# Patient Record
Sex: Male | Born: 2000 | Race: Black or African American | Hispanic: No | Marital: Single | Smoking: Former smoker
Health system: Southern US, Community
[De-identification: ages and names within clinical notes are randomized; demographics above are authoritative.]

## PROBLEM LIST (undated history)

## (undated) DIAGNOSIS — R278 Other lack of coordination: Secondary | ICD-10-CM

## (undated) DIAGNOSIS — F902 Attention-deficit hyperactivity disorder, combined type: Secondary | ICD-10-CM

## (undated) DIAGNOSIS — F951 Chronic motor or vocal tic disorder: Secondary | ICD-10-CM

## (undated) HISTORY — DX: Attention-deficit hyperactivity disorder, combined type: F90.2

## (undated) HISTORY — DX: Other lack of coordination: R27.8

## (undated) HISTORY — DX: Chronic motor or vocal tic disorder: F95.1

---
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
## 2020-03-11 ENCOUNTER — Other Ambulatory Visit: Payer: Self-pay

## 2020-03-11 NOTE — Telephone Encounter (Signed)
Mom called in for refill for Evekeo and Buspar. Last visit 12/19/2019 next visit 05/05/2020. Please escribe to [facility]

## 2020-03-12 MED ORDER — BUSPIRONE HCL 15 MG PO TABS: 15.0000 mg | ORAL_TABLET | Freq: Every day | ORAL | 2 refills | Status: DC

## 2020-03-12 MED ORDER — AMPHETAMINE SULFATE 10 MG PO TABS: 10.0000 mg | ORAL_TABLET | Freq: Two times a day (BID) | ORAL | 0 refills | Status: DC

## 2020-03-12 NOTE — Telephone Encounter (Signed)
RX for above e-scribed and sent to pharmacy on record  [facility] - [place], [place] - [address] [place] [address] Phone: [phone] Fax: [phone]    

## 2020-05-05 ENCOUNTER — Encounter: Payer: Medicaid Other | Admitting: Pediatrics

## 2020-05-21 ENCOUNTER — Other Ambulatory Visit: Payer: Self-pay

## 2020-05-21 ENCOUNTER — Telehealth: Payer: Medicaid Other | Admitting: Pediatrics

## 2020-06-01 ENCOUNTER — Encounter: Payer: Self-pay | Admitting: Pediatrics

## 2020-06-01 ENCOUNTER — Telehealth (INDEPENDENT_AMBULATORY_CARE_PROVIDER_SITE_OTHER): Payer: Medicaid Other | Admitting: Pediatrics

## 2020-06-01 ENCOUNTER — Other Ambulatory Visit: Payer: Self-pay

## 2020-06-01 DIAGNOSIS — Z79899 Other long term (current) drug therapy: Secondary | ICD-10-CM

## 2020-06-01 DIAGNOSIS — Z719 Counseling, unspecified: Secondary | ICD-10-CM

## 2020-06-01 DIAGNOSIS — F902 Attention-deficit hyperactivity disorder, combined type: Secondary | ICD-10-CM | POA: Diagnosis not present

## 2020-06-01 DIAGNOSIS — Z7189 Other specified counseling: Secondary | ICD-10-CM

## 2020-06-01 DIAGNOSIS — R278 Other lack of coordination: Secondary | ICD-10-CM | POA: Diagnosis not present

## 2020-06-01 MED ORDER — BUSPIRONE HCL 15 MG PO TABS: 15.0000 mg | ORAL_TABLET | Freq: Every day | ORAL | 2 refills | Status: DC

## 2020-06-01 MED ORDER — AMPHETAMINE SULFATE 10 MG PO TABS: 10.0000 mg | ORAL_TABLET | Freq: Two times a day (BID) | ORAL | 0 refills | Status: DC

## 2020-06-01 NOTE — Patient Instructions (Signed)
DISCUSSION: Counseled regarding the following coordination of care items:  Continue medication as directed Evekeo 10 mg twice daily Buspar 15 mg daily RX for above e-scribed and sent to pharmacy on record  [facility] [place], [place] - [address] [place] [address] Phone: [phone] Fax: [phone]   Counseled regarding obtaining refills by calling pharmacy first to use automated refill request then if needed, call our office leaving a detailed message on the refill line.  Counseled medication administration, effects, and possible side effects.  ADHD medications discussed to include different medications and pharmacologic properties of each. Recommendation for specific medication to include dose, administration, expected effects, possible side effects and the risk to benefit ratio of medication management.  Advised importance of:  Good sleep hygiene (8- 10 hours per night) Continue good sleep behaviors Limited screen time (none on school nights, no more than 2 hours on weekends) Encouraged drawing on paper not on our skin Regular exercise(outside and active play) Continue with soccer and as many in person social activities as is safe to attend Healthy eating (drink water, no sodas/sweet tea) Excellent and continue good habits.  Counseling at this visit included the review of old records and/or current chart.   Counseling included the following discussion points presented at every visit to improve understanding and treatment compliance.  School progress and continued advocay for appropriate accommodations to include maintain Structure, routine, organization, reward, motivation and consequences.  Additionally the patient was counseled to take medication while driving.

## 2020-06-01 NOTE — Progress Notes (Signed)
[facility] [address], [place] [place] [address] Dept: [phone] Dept Fax: [phone]  Medication Check by Caregility due to COVID-19  Patient ID:  Benjamin Frank  male DOB: 2000-10-14   20 y.o.   MRN: [id number]   DATE:06/01/20  PCP: Loyola Mast, MD  Interviewed: Dione Housekeeper and Mother: Natale Milch Location: Parents home Provider location: [facility] office  Virtual Visit via Video Note Connected with Benjamin Frank on 06/01/20 at  9:30 AM EST by video enabled telemedicine application and verified that I am speaking with the correct person using two identifiers.     I discussed the limitations, risks, security and privacy concerns of performing an evaluation and management service by telephone and the availability of in person appointments. I also discussed with the parent/patient that there may be a patient responsible charge related to this service. The parent/patient expressed understanding and agreed to proceed.  HISTORY OF PRESENT ILLNESS/CURRENT STATUS: Benjamin Frank is being followed for medication management for ADHD, dysgraphia and learning differences.   Last visit on 12/19/19  Benjamin Frank currently prescribed Evekeo 10 mg twice daily, Buspar 15 mg    Behaviors: doing well, personable and maturing  Eating well (eating breakfast, lunch and dinner).   Elimination: no concerns  Sleeping:  Sleeping through the night.   EDUCATION: School: [employer]  Five classes Stats, astronomy, Forensic scientist, lab, Genetics Doing well with some on line but mostly in person   Has zoom links for every class except lab - has to be in person  Working - [employer], now shift Sport and exercise psychologist Exercise: daily  Soccer - avid player  Screen time: (phone, tablet, TV, computer): non-essential, not excessive  MEDICAL HISTORY: Individual Medical History/ Review of Systems: Changes? : New tat on left  forearm  Family Medical/ Social History: Changes? No   Patient Lives with: mother and father  Current Medications:  Evekeo 10 mg twice daily Buspar 15 mg every day  Medication Side Effects: None  MENTAL HEALTH: No current issues with  sadness, loneliness or depression.  No self harm or thoughts of self harm or injury. - new tat Denies fears, worries and anxieties. Has good peer relations and is not a bully nor is victimized.  ASSESSMENT: Doing well, compliant with medication verbally.  Last refill per PDMP aware 10/14/2019.  Non compliant.  Counseled daily medication.  Counseled regarding continued vape cessation and support for alternative behaviors/activities. Discussed strategies for work production at [employer] and college success (dysgraphia coping).  DIAGNOSES:    ICD-10-CM   1. ADHD (attention deficit hyperactivity disorder), combined type  F90.2   2. Dysgraphia  R27.8   3. Medication management  Z79.899   4. Patient counseled  Z71.9   5. Parenting dynamics counseling  Z71.89   6. Counseling and coordination of care  Z71.89      RECOMMENDATIONS:  Patient Instructions  DISCUSSION: Counseled regarding the following coordination of care items:  Continue medication as directed Evekeo 10 mg twice daily Buspar 15 mg daily RX for above e-scribed and sent to pharmacy on record  [facility] - [place], [place] - [address] [place] [address] Phone: [phone] Fax: [phone]   Counseled regarding obtaining refills by calling pharmacy first to use automated refill request then if needed, call our office leaving a detailed message on the refill line.  Counseled medication administration, effects, and possible  side effects.  ADHD medications discussed to include different medications and pharmacologic properties of each. Recommendation for specific medication to include dose, administration, expected effects, possible side  effects and the risk to benefit ratio of medication management.  Advised importance of:  Good sleep hygiene (8- 10 hours per night) Continue good sleep behaviors Limited screen time (none on school nights, no more than 2 hours on weekends) Encouraged drawing on paper not on our skin Regular exercise(outside and active play) Continue with soccer and as many in person social activities as is safe to attend Healthy eating (drink water, no sodas/sweet tea) Excellent and continue good habits.  Counseling at this visit included the review of old records and/or current chart.   Counseling included the following discussion points presented at every visit to improve understanding and treatment compliance.  School progress and continued advocay for appropriate accommodations to include maintain Structure, routine, organization, reward, motivation and consequences.  Additionally the patient was counseled to take medication while driving.           NEXT APPOINTMENT:  Return in about 6 months (around 11/29/2020) for Medication Check. Please call the office for a sooner appointment if problems arise.  Medical Decision-making:  I spent 25 minutes dedicated to the care of this patient on the date of this encounter to include face to face time with the patient and/or parent reviewing medical records and documentation by teachers, performing and discussing the assessment and treatment plan, reviewing and explaining completed speciality labs and obtaining specialty lab samples.  The patient and/or parent was provided an opportunity to ask questions and all were answered. The patient and/or parent agreed with the plan and demonstrated an understanding of the instructions.   The patient and/or parent was advised to call back or seek an in-person evaluation if the symptoms worsen or if the condition fails to improve as anticipated.  I provided 25 minutes of non-face-to-face time during this  encounter.   Completed record review for 5 minutes prior to and after the virtual video visit.   Counseling Time: 25 minutes   Total Contact Time: 30 minutes

## 2020-09-22 ENCOUNTER — Encounter: Payer: Medicaid Other | Admitting: Pediatrics

## 2020-09-27 ENCOUNTER — Encounter: Payer: Medicaid Other | Admitting: Pediatrics

## 2020-12-13 ENCOUNTER — Encounter: Payer: Self-pay | Admitting: Pediatrics

## 2020-12-13 ENCOUNTER — Ambulatory Visit (INDEPENDENT_AMBULATORY_CARE_PROVIDER_SITE_OTHER): Payer: Medicaid Other | Admitting: Pediatrics

## 2020-12-13 ENCOUNTER — Other Ambulatory Visit: Payer: Self-pay

## 2020-12-13 VITALS — Ht 68.5 in | Wt 164.0 lb

## 2020-12-13 DIAGNOSIS — Z719 Counseling, unspecified: Secondary | ICD-10-CM

## 2020-12-13 DIAGNOSIS — F902 Attention-deficit hyperactivity disorder, combined type: Secondary | ICD-10-CM | POA: Diagnosis not present

## 2020-12-13 DIAGNOSIS — R278 Other lack of coordination: Secondary | ICD-10-CM

## 2020-12-13 DIAGNOSIS — Z7189 Other specified counseling: Secondary | ICD-10-CM | POA: Diagnosis not present

## 2020-12-13 DIAGNOSIS — Z79899 Other long term (current) drug therapy: Secondary | ICD-10-CM

## 2020-12-13 MED ORDER — BUSPIRONE HCL 15 MG PO TABS: 15.0000 mg | ORAL_TABLET | Freq: Every day | ORAL | 2 refills | Status: DC

## 2020-12-13 MED ORDER — AMPHETAMINE SULFATE 10 MG PO TABS: 10.0000 mg | ORAL_TABLET | Freq: Two times a day (BID) | ORAL | 0 refills | Status: DC

## 2020-12-13 NOTE — Patient Instructions (Addendum)
DISCUSSION: Counseled regarding the following coordination of care items:  Continue medication as directed Evekeo 10 mg 1 or 2 daily BuSpar 15 mg daily RX for above e-scribed and sent to pharmacy on record  [facility] [place], [place] - [address] [place] [address] Phone: [phone] Fax: [phone]   Advised importance of:  Sleep Maintain good sleep hygiene with bedtime no later than 2400 Limited screen time (none on school nights, no more than 2 hours on weekends) Always avoid nonsense social media and decrease screen time overall Regular exercise(outside and active play) Continue with good physical activities and sports participation Healthy eating (drink water, no sodas/sweet tea) Protein rich diet avoiding junk food and empty calories

## 2020-12-13 NOTE — Progress Notes (Signed)
Medication Check  Patient ID: Benjamin Frank  DOB: [id number]  MRN: [id number]  DATE:12/13/20 Loyola Mast, MD  Accompanied by: Mother Patient Lives with: mother and father [name] 23 and [name]  HISTORY/CURRENT STATUS: Chief Complaint - Polite and cooperative and present for medical follow up for medication management of ADHD, dysgraphia and  learning differences. Last follow up in person 12/19/19 and 06/01/20 by video. Evekeo 10 mg, using 20 mg every morning per patient's however last refill filled on 06/01/2020 for a 60 capsule dispensed.  [name] has largely been unmedicated.  BuSpar 15 mg taking 1 daily last written on 06/01/2020 for a 30-day supply with 2 refills.  He states he is also taking this medication.    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  No Sleep concerns.  Elimination: no concerns  Individual Medical History/ Review of Systems: Changes? :No  Family Medical/ Social History: Changes? No  MENTAL HEALTH: Denies sadness, loneliness or depression.  Denies self harm or thoughts of self harm or injury. Denies fears, worries and anxieties. has good peer relations and is not a bully nor is victimized.  Work is a stress   PHYSICAL EXAM; Vitals:   12/13/20 1442  Weight: 164 lb (74.4 kg)  Height: 5' 8.5" (1.74 m)   Body mass index is 24.57 kg/m.  General Physical Exam: Unchanged from previous exam, date:12/18/20    ASSESSMENT:  [name] is a 20 year old with a  diagnosis of ADHD/dysgraphia that is largely unmedicated.  His last refills were written and feels around 06/01/2020 for quantities that should have run out after at most 2 months.  [name] is largely unmedicated but remain successful.  There are no behavioral concerns at home and he is successful academically as well as during his work with consideration of promotion. We discussed the continued realization of when he is off medication versus on medication and differences in his performance and executive function.  He is certainly old enough to self determine if medication continues to be beneficial.  I do recommend daily medication especially for academic success. Continue good physical active play and involvement in sports as well as maintaining good sleep hygiene.  Reducing screen time and getting adequate calories that are protein rich avoiding junk food and empty calories. We will meet again in 6 months to discuss progress and address medication needs at that time.  DIAGNOSES:    ICD-10-CM   1. ADHD (attention deficit hyperactivity disorder), combined type  F90.2     2. Dysgraphia  R27.8     3. Medication management  Z79.899     4. Patient counseled  Z71.9     5. Parenting dynamics counseling  Z71.89       RECOMMENDATIONS:  Patient Instructions  DISCUSSION: Counseled regarding the following coordination of care items:  Continue medication as directed Evekeo 10 mg 1 or 2 daily BuSpar 15 mg daily RX for above e-scribed and sent to pharmacy on record  [facility] [place], [place] [address] [place] [address] Phone: [phone] Fax: [phone]   Advised importance of:  Sleep Maintain good sleep hygiene with bedtime no later than 2400 Limited screen time (none on school nights, no more than 2 hours on weekends) Always avoid nonsense social media and decrease screen time overall Regular exercise(outside and active  play) Continue with good physical activities and sports participation Healthy eating (drink water, no sodas/sweet tea) Protein rich diet avoiding junk food and empty calories    Mother and patient verbalized understanding of all topics discussed.  NEXT APPOINTMENT:  Return in about 6 months (around 06/15/2021) for Medication Check.  Disclaimer: This documentation was generated through the use of dictation and/or voice recognition software, and as such, may contain spelling or other transcription errors. Please disregard any inconsequential errors.  Any questions regarding the content of this documentation should be directed to the individual who electronically signed.

## 2021-01-29 DIAGNOSIS — H5213 Myopia, bilateral: Secondary | ICD-10-CM | POA: Diagnosis not present

## 2021-05-05 DIAGNOSIS — Z6824 Body mass index (BMI) 24.0-24.9, adult: Secondary | ICD-10-CM | POA: Diagnosis not present

## 2021-05-05 DIAGNOSIS — L814 Other melanin hyperpigmentation: Secondary | ICD-10-CM | POA: Diagnosis not present

## 2021-05-05 DIAGNOSIS — F419 Anxiety disorder, unspecified: Secondary | ICD-10-CM | POA: Diagnosis not present

## 2021-05-05 DIAGNOSIS — L819 Disorder of pigmentation, unspecified: Secondary | ICD-10-CM | POA: Diagnosis not present

## 2021-05-05 DIAGNOSIS — F901 Attention-deficit hyperactivity disorder, predominantly hyperactive type: Secondary | ICD-10-CM | POA: Diagnosis not present

## 2021-05-17 ENCOUNTER — Ambulatory Visit (INDEPENDENT_AMBULATORY_CARE_PROVIDER_SITE_OTHER): Payer: Medicaid Other | Admitting: Pediatrics

## 2021-05-17 ENCOUNTER — Encounter: Payer: Self-pay | Admitting: Pediatrics

## 2021-05-17 ENCOUNTER — Other Ambulatory Visit: Payer: Self-pay

## 2021-05-17 VITALS — Ht 69.0 in | Wt 160.0 lb

## 2021-05-17 DIAGNOSIS — Z79899 Other long term (current) drug therapy: Secondary | ICD-10-CM | POA: Diagnosis not present

## 2021-05-17 DIAGNOSIS — Z7189 Other specified counseling: Secondary | ICD-10-CM

## 2021-05-17 DIAGNOSIS — R278 Other lack of coordination: Secondary | ICD-10-CM | POA: Diagnosis not present

## 2021-05-17 DIAGNOSIS — F902 Attention-deficit hyperactivity disorder, combined type: Secondary | ICD-10-CM

## 2021-05-17 DIAGNOSIS — Z719 Counseling, unspecified: Secondary | ICD-10-CM

## 2021-05-17 MED ORDER — AMPHETAMINE SULFATE 10 MG PO TABS: 10.0000 mg | ORAL_TABLET | Freq: Two times a day (BID) | ORAL | 0 refills | Status: DC

## 2021-05-17 MED ORDER — BUSPIRONE HCL 15 MG PO TABS: 15.0000 mg | ORAL_TABLET | Freq: Every day | ORAL | 2 refills | Status: DC

## 2021-05-17 NOTE — Patient Instructions (Signed)
DISCUSSION: Counseled regarding the following coordination of care items:  Continue medication as directed Evekeo 10 mg every morning Buspar 15 mg every morning RX for above e-scribed and sent to pharmacy on record  [facility] #[id number] [place], [place] - [address] AT [facility] & [address] [place] [address] Phone: [phone] Fax: [phone]  Advised importance of:  Sleep Maintain good routines, avoid late nights Limited screen time (none on school nights, no more than 2 hours on weekends) Always reduce screen time Regular exercise(outside and active play) Daily physical activities Healthy eating (drink water, no sodas/sweet tea) Protein rich and avoid junk  Additional resources for parents:  [facility] - https://childmind.org/ [employer]ca

## 2021-05-17 NOTE — Progress Notes (Signed)
Medication Check  Patient ID: Benjamin Frank  DOB: [birth year]  MRN: [id number]  DATE:05/17/21 Benjamin Mast, MD  Accompanied by: Self Patient Lives with: mother and father Brothers - Richard 24 and [name]  HISTORY/CURRENT STATUS: Chief Complaint - Polite and cooperative and present for medical follow up for medication management of ADHD, dysgraphia and learning differences. Last follow up 12/13/20 and currently prescribed Evekeo 10 mg taking one for school and usually two for testing days.  Bupsar 15 mg every morning.   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  MEDICAL HISTORY: Appetite: WNL   Sleep: Bedtime: 2 am on break - earlier on school days   Concerns: Initiation/Maintenance/Other: Asleep easily, sleeps through the night, feels well-rested.  No Sleep concerns.  Elimination: no concerns  Individual Medical History/ Review of Systems: Changes? :Yes will have cyst off ear - otoplasty pending  Family Medical/ Social History: Changes? No  MENTAL HEALTH: Denies sadness, loneliness or depression.  Denies self harm or thoughts of self harm or injury. Denies fears, worries and anxieties. Has good peer relations and is not a bully nor is victimized.  Depression screen [facility] 2/9 05/17/2021  Decreased Interest 0  Down, Depressed, Hopeless 0  PHQ - 2 Score 0   GAD 7 : Generalized Anxiety Score 05/17/2021  Nervous, Anxious, on Edge 0  Control/stop worrying 0  Worry too much - different things 0  Trouble relaxing  0  Restless 0  Easily annoyed or irritable 0  Afraid - awful might happen 0  Total GAD 7 Score 0  Anxiety Difficulty Not difficult at all     Adult ADHD Self Report Scale (most recent)     Adult ADHD Self-Report Scale (ASRS-v1.1) Symptom Checklist - 05/17/21 1410       Part A   1. How often do you have trouble wrapping up the final details of a project, once the challenging parts have been done? Sometimes  2. How often do you have difficulty getting things done in order when you have to do a task that requires organization? Sometimes    3. How often do you have problems remembering appointments or obligations? Sometimes  4. When you have a task that requires a lot of thought, how often do you avoid or delay getting started? Rarely    5. How often do you fidget or squirm with your hands or feet when you have to sit down for a long time? Very Often  6. How often do you feel overly active and compelled to do things, like you were driven by a motor? Often      Part B   7. How often do you make careless mistakes when you have to work on a boring or difficult project? Rarely  8. How often do you have difficulty keeping your attention when you are doing boring or repetitive work? Never    9. How often do you have difficulty concentrating on what people say to you, even when they are speaking  to you directly? Rarely  10. How often do you misplace or have difficulty finding things at home or at work? Rarely    11. How often are you distracted by activity or noise around you? Sometimes  12. How often do you leave your seat in meetings or other situations in which you are expected to remain seated? Never    13. How often do you feel restless or fidgety? Rarely  14. How often do you have difficulty unwinding and relaxing when you have time to yourself? Never    15. How often do you find yourself talking too much when you are in social situations? Rarely  16. When you are in a conversation, how often do  you find yourself finishing the sentences of the people you are talking to, before they can finish them themselves? Never    17. How often do you have difficulty waiting your turn in situations when turn taking is required? Rarely  18. How often do you interrupt others when they are busy? Never              PHYSICAL EXAM; Vitals:   05/17/21 1354  Weight: 160 lb (72.6 kg)  Height: 5\' 9"  (1.753 m)   Body mass index is 23.63 kg/m.  General Physical Exam: Unchanged from previous exam, date:12/13/20   ASSESSMENT:  Benjamin Frank is 71-years of age with a diagnosis of ADHD/Dysgraphia that is well controlled with current medication used for as needed school work/testing days.  Maintain good sleep schedules, avoid late nights. Decrease all screen time, improve social experiences. Protein rich and avoid junk and empty calories. Daily physical activities. ADHD stable with medication management  DIAGNOSES:    ICD-10-CM   1. ADHD (attention deficit hyperactivity disorder), combined type  F90.2     2. Dysgraphia  R27.8     3. Medication management  Z79.899     4. Patient counseled  Z71.9     5. Parenting dynamics counseling  Z71.89       RECOMMENDATIONS:  Patient Instructions  DISCUSSION: Counseled regarding the following coordination of care items:  Continue medication as directed Evekeo 10 mg every morning Buspar 15 mg every morning RX for above e-scribed and sent to pharmacy on record  [id number], [place] - [address] AT [facility] [address] [place] Phone: [phone] Fax: [phone]  Advised importance of:  Sleep Maintain good routines, avoid late nights Limited screen time (none on school nights, no more than 2 hours on weekends) Always reduce screen time Regular exercise(outside and active play) Daily physical activities Healthy eating (drink water, no sodas/sweet tea) Protein rich and  avoid junk  Additional resources for parents:  Child Mind Institute - https://childmind.org/ ADDitude Magazine 709-628-3662       Mother verbalized understanding of all topics discussed.  NEXT APPOINTMENT:  Return in about 6 months (around 11/14/2021) for Medication Check.  Disclaimer: This documentation was generated through the use of dictation and/or voice recognition software, and as such, may contain spelling or other transcription errors. Please disregard any inconsequential errors.  Any questions regarding the content of this documentation should be directed to the individual who electronically signed.

## 2021-07-01 ENCOUNTER — Encounter: Payer: Self-pay | Admitting: Pediatrics

## 2021-08-25 DIAGNOSIS — S63259A Unspecified dislocation of unspecified finger, initial encounter: Secondary | ICD-10-CM | POA: Diagnosis not present

## 2021-08-25 DIAGNOSIS — Y9379 Activity, other specified sports and athletics: Secondary | ICD-10-CM | POA: Diagnosis not present

## 2021-08-25 DIAGNOSIS — S6391XA Sprain of unspecified part of right wrist and hand, initial encounter: Secondary | ICD-10-CM | POA: Diagnosis not present

## 2021-10-14 ENCOUNTER — Telehealth (INDEPENDENT_AMBULATORY_CARE_PROVIDER_SITE_OTHER): Payer: Medicaid Other | Admitting: Pediatrics

## 2021-10-14 ENCOUNTER — Encounter: Payer: Self-pay | Admitting: Pediatrics

## 2021-10-14 DIAGNOSIS — F902 Attention-deficit hyperactivity disorder, combined type: Secondary | ICD-10-CM

## 2021-10-14 DIAGNOSIS — Z719 Counseling, unspecified: Secondary | ICD-10-CM

## 2021-10-14 DIAGNOSIS — Z79899 Other long term (current) drug therapy: Secondary | ICD-10-CM | POA: Diagnosis not present

## 2021-10-14 MED ORDER — BUSPIRONE HCL 15 MG PO TABS: 15.0000 mg | ORAL_TABLET | Freq: Every day | ORAL | 2 refills | Status: DC

## 2021-10-14 MED ORDER — AMPHETAMINE SULFATE 10 MG PO TABS: 10.0000 mg | ORAL_TABLET | Freq: Two times a day (BID) | ORAL | 0 refills | Status: DC

## 2021-10-14 NOTE — Patient Instructions (Signed)
DISCUSSION: Counseled regarding the following coordination of care items:  Continue medication as directed Evekeo 10 mg twice daily, as needed for class/work Buspar 15 mg daily   Discussed transition of care back to PCP for medication management.

## 2021-10-14 NOTE — Progress Notes (Signed)
[facility] [address] [place] [place] [address] Dept: [phone] Dept Fax: [phone]  Medication Check by Caregility due to COVID-19  Patient ID:  Benjamin Frank  male DOB: 01/05/2001   21 y.o.   MRN: [id number]   DATE:10/14/21  Interviewed: Dione Housekeeper and Mother  Name: Benjamin Frank Location: Their home Provider location: [facility] office  Virtual Visit via Video Note Connected with ROSIE GOLSON Napp on 10/14/21 at  2:00 PM EDT by video enabled telemedicine application and verified that I am speaking with the correct person using two identifiers.    I discussed the limitations, risks, security and privacy concerns of performing an evaluation and management service by telephone and the availability of in person appointments. I also discussed with the parent/patient that there may be a patient responsible charge related to this service. The parent/patient expressed understanding and agreed to proceed.  HISTORY OF PRESENT ILLNESS/CURRENT STATUS: Benjamin Frank is being followed for medication management for ADHD, dysgraphia and learning difference, with a history of learning differences.   Last visit on 06/13/21  Zachery currently prescribed Evekeo 10 mg - taking sporadically. Mother feels daily medication with school and soccer.   Taking BusPar 15 mg daily - mother is not sure if he is taking it consistently.  May be taking as needed for anxiety flares. Mother reports less outbursts and less quick to temper. None since holiday time and family was "all out war". Parents are parenting differently, trying to adult parenting styles. Overall outbursts are less aggressive. More congenial with younger brother. Has a steady girlfriend, doing well in the relationship. Has been helping him stay more grounded. Counseled regarding tasks of young adults social emotional/executive function maturation.  Behaviors: works at  [employer]  Taking classes at [employer] - did not do well in a Science class, so redoing and may transfer to [employer] in the Spring 2024  Eating well (eating breakfast, lunch and dinner). No concerns. 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 Travel team and may get picked up in [facility]". [employer] with [employer] for four years Elimination: no concerns Sleeping: Sleeping through the night.  Counseled regarding maintaining healthy lifestyle to include prioritize sleep hygiene, continue physical activities and protein rich food avoiding junk and empty calories.  Adequate hydration and caloric intake to support athletic activities.  MEDICAL HISTORY: Individual Medical History/ Review of Systems: Changes? :No  Family Medical/ Social History: Changes? No   Patient Lives with: parents and brother age 42 and 27  MENTAL HEALTH: No concerns  ASSESSMENT:  Marcus is 28-years of age with a diagnosis of ADHD with generalized anxiety that is improved and well controlled with current medication. Counseled regarding medication management and transition of care to PCP.  Parents will seek refills from that office.  No follow-ups scheduled. No medication changes at this time.  DIAGNOSES:    ICD-10-CM   1. ADHD (attention deficit hyperactivity disorder), combined type  F90.2     2. Medication management  Z79.899     3. Patient counseled  Z71.9        RECOMMENDATIONS:  Patient Instructions  DISCUSSION: Counseled regarding the following coordination of care items:  Continue medication as directed Evekeo 10 mg twice daily, as needed for class/work Buspar 15 mg daily   Discussed transition of care back to PCP for medication management.  NEXT APPOINTMENT:  Return in about 6 months (around 04/15/2022), or if symptoms worsen or fail to improve. Please call the office for a sooner appointment if problems  arise.  Medical Decision-making:  I spent 20 minutes dedicated to the care of this patient on the date of this encounter to include face to face time with the patient and/or parent reviewing medical records and documentation by teachers, performing and discussing the assessment and treatment plan, reviewing and explaining completed speciality labs and obtaining specialty lab samples.  The patient and/or parent was provided an opportunity to ask questions and all were answered. The patient and/or parent agreed with the plan and demonstrated an understanding of the instructions.   The patient and/or parent was advised to call back or seek an in-person evaluation if the symptoms worsen or if the condition fails to improve as anticipated.  I provided 20 minutes of video-face-to-face time during this encounter.   Completed record review for 5 minutes prior to and after the virtual visit.   Disclaimer: This documentation was generated through the use of dictation and/or voice recognition software, and as such, may contain spelling or other transcription errors. Please disregard any inconsequential errors.  Any questions regarding the content of this documentation should be directed to the individual who electronically signed.

## 2021-12-22 ENCOUNTER — Other Ambulatory Visit: Payer: Self-pay

## 2021-12-22 MED ORDER — BUSPIRONE HCL 15 MG PO TABS: 15.0000 mg | ORAL_TABLET | Freq: Every day | ORAL | 2 refills | Status: DC

## 2021-12-22 MED ORDER — AMPHETAMINE SULFATE 10 MG PO TABS: 10.0000 mg | ORAL_TABLET | Freq: Two times a day (BID) | ORAL | 0 refills | Status: DC

## 2021-12-22 NOTE — Telephone Encounter (Signed)
RX for above e-scribed and sent to pharmacy on record  [id number] - [place], [place] - [address] AT [address] [place] [address] Phone: [phone] Fax: [phone] 

## 2022-03-04 DIAGNOSIS — R200 Anesthesia of skin: Secondary | ICD-10-CM | POA: Diagnosis not present

## 2022-03-04 DIAGNOSIS — R202 Paresthesia of skin: Secondary | ICD-10-CM | POA: Diagnosis not present

## 2022-03-04 DIAGNOSIS — M25511 Pain in right shoulder: Secondary | ICD-10-CM | POA: Diagnosis not present

## 2022-03-05 DIAGNOSIS — M25532 Pain in left wrist: Secondary | ICD-10-CM | POA: Diagnosis not present

## 2022-03-05 DIAGNOSIS — M25511 Pain in right shoulder: Secondary | ICD-10-CM | POA: Diagnosis not present

## 2022-03-05 DIAGNOSIS — M542 Cervicalgia: Secondary | ICD-10-CM | POA: Diagnosis not present

## 2022-04-28 ENCOUNTER — Encounter: Payer: Self-pay | Admitting: Pediatrics

## 2022-04-28 ENCOUNTER — Ambulatory Visit (INDEPENDENT_AMBULATORY_CARE_PROVIDER_SITE_OTHER): Payer: Medicaid Other | Admitting: Pediatrics

## 2022-04-28 VITALS — Ht 68.25 in | Wt 169.0 lb

## 2022-04-28 DIAGNOSIS — Z719 Counseling, unspecified: Secondary | ICD-10-CM | POA: Diagnosis not present

## 2022-04-28 DIAGNOSIS — R278 Other lack of coordination: Secondary | ICD-10-CM

## 2022-04-28 DIAGNOSIS — Z7189 Other specified counseling: Secondary | ICD-10-CM | POA: Diagnosis not present

## 2022-04-28 DIAGNOSIS — Z79899 Other long term (current) drug therapy: Secondary | ICD-10-CM | POA: Diagnosis not present

## 2022-04-28 DIAGNOSIS — F902 Attention-deficit hyperactivity disorder, combined type: Secondary | ICD-10-CM

## 2022-04-28 MED ORDER — BUSPIRONE HCL 15 MG PO TABS: 15.0000 mg | ORAL_TABLET | Freq: Every day | ORAL | 2 refills | Status: DC

## 2022-04-28 MED ORDER — AMPHETAMINE SULFATE 10 MG PO TABS: 10.0000 mg | ORAL_TABLET | Freq: Two times a day (BID) | ORAL | 0 refills | Status: DC

## 2022-04-28 NOTE — Progress Notes (Signed)
Medication Check  Patient ID: Benjamin Frank  DOB: 1928  MRN: [id number]  DATE:04/28/22 Benjamin Flake, MD  Accompanied by: Mother Patient Lives with: mother, father, and brother age Benjamin Frank 24 years, Benjamin Frank is 18 years  HISTORY/CURRENT STATUS: Chief Complaint - Polite and cooperative and present for medical follow up for medication management of ADHD and learning differences.  Last follow-up in person 05/17/2021 and by video 10/14/2021. Currently prescribed and not taking Evekeo 10 mg twice daily and BuSpar 15 mg daily. Last picked up stimulant prescription via PDMP aware on 05/19/2021. Mother reports that he has numerous bottles.  He reports having taken stimulant medication the last time approximately May 2023. Mother reports that he still needs medication to be effective in school as well as getting along with others.  He is doing semi-Pro/professional soccer and is frequently in trouble for impulsive acts and comments while on the field.     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 Will play another season, may be on a different team.  Trying out for [place] [employer] - pro soccer at [employer] daily physical activities Screen time: (phone, tablet, TV, computer): Needs screen time reduction Counseled screen time reduction Driving: good, no issues Had a speeding ticket - had to pay, not points Counseled medication while driving MEDICAL HISTORY: Appetite: No concerns Counseled protein rich foods avoiding junk and empty calories Sleep: No concerns Counseled maintain  good sleep routines and avoid late nights Elimination: No concerns Counseled safe sexuality practices Individual Medical History/ Review of Systems: Changes? :No  Family Medical/ Social History: Changes? No  MENTAL HEALTH: No concerns The following screening was completed with patient and counseling points provided based on responses:     04/28/2022    4:40 PM 05/17/2021    2:09 PM  Depression screen PHQ 2/9  Decreased Interest 0 0  Down, Depressed, Hopeless 0 0  PHQ - 2 Score 0 0        04/28/2022    4:40 PM 05/17/2021    2:09 PM  GAD 7 : Generalized Anxiety Score  Nervous, Anxious, on Edge 0 0  Control/stop worrying 0 0  Worry too much - different things 0 0  Trouble relaxing 0 0  Restless 0 0  Easily annoyed or irritable 0 0  Afraid - awful might happen 0 0  Total GAD 7 Score 0 0  Anxiety Difficulty Not difficult at all Not difficult at all       PHYSICAL EXAM; Vitals:   04/28/22 1634  Weight: 169 lb (76.7 kg)  Height: 5' 8.25" (1.734 m)   Body mass index is 25.51 kg/m. Facility age limit for growth %iles is 20 years.  General Physical Exam: Unchanged from previous exam, date: 05/17/2021   ASSESSMENT:  Jaqualin is 30-years of age with a diagnosis of ADHD with learning differences that is being improvement in social emotional maturation but continues to require medication management. He has been largely unmedicated this past year and has had numerous issues with relationships at home, on the field and issues with driving in class work. Anticipatory guidance with counseling  and education provided to the patient and the mother during this visit as indicated in the note above. I do recommend daily medication especially for academic performance and driving.    Transition of care to PCP or FNP at [facility] due to aging out of my practice as APNP. Mother will call to schedule 6 months if they decide to stay at [facility]. I spent 40 minutes face to face on the date of service and  engaged in the above activities to include counseling and education.   DIAGNOSES:    ICD-10-CM   1. ADHD (attention deficit hyperactivity disorder), combined type  F90.2     2. Dysgraphia  R27.8     3. Medication management  Z79.899     4. Patient counseled  Z71.9     5. Parenting dynamics counseling  Z71.89       RECOMMENDATIONS:  Patient Instructions  DISCUSSION: Counseled regarding the following coordination of care items:  Continue medication as directed Evekeo 10 mg twice daily BuSpar 15 mg daily  RX for above e-scribed and sent to pharmacy on record  [facility], [place] - [address] [place] [address] Phone: [phone] Fax: [phone]  Transition of care back to PCP or maintain at [facility] with FNP which is mother's preference. Family will call back to schedule in person follow-up in 6 months if returning to [facility]   Advised importance of:  Sleep Maintain good sleep routines and avoid late nights Limited screen time (none on school nights, no more than 2 hours on weekends) Continued screen time reduction reducing all social media which causes feelings of social isolation and depression which can be expressed as irritability in young adults Regular exercise(outside and active play) Maintain daily physical activity Healthy eating (drink water, no sodas/sweet tea) Maintain protein rich foods avoiding junk and empty calories   Additional resources for parents:  Child Mind Institute - https://childmind.org/ ADDitude Magazine ThirdIncome.ca       Patient and mother verbalized understanding of all topics discussed.  NEXT APPOINTMENT:  Return in about 6 months (around 10/28/2022) for Medication Check.  Disclaimer: This documentation was generated through the use of dictation and/or voice recognition software, and as such, may contain spelling or other transcription errors. Please  disregard any inconsequential errors.  Any questions regarding the content of this documentation should be directed to the individual who electronically signed.

## 2022-04-28 NOTE — Patient Instructions (Signed)
DISCUSSION: Counseled regarding the following coordination of care items:  Continue medication as directed Evekeo 10 mg twice daily BuSpar 15 mg daily  RX for above e-scribed and sent to pharmacy on record  [facility] [place], [place] - [address] [facility] [address] [place] [address] Phone: [phone] Fax: [phone]  Transition of care back to PCP or maintain at [facility] with FNP which is mother's preference. Family will call back to schedule in person follow-up in 6 months if returning to [facility]   Advised importance of:  Sleep Maintain good sleep routines and avoid late nights Limited screen time (none on school nights, no more than 2 hours on weekends) Continued screen time reduction reducing all social media which causes feelings of social isolation and depression which can be expressed as irritability in young adults Regular exercise(outside and active play) Maintain daily physical activity Healthy eating (drink water, no sodas/sweet tea) Maintain protein rich foods avoiding junk and empty calories   Additional resources for parents:  Child Mind Institute - https://childmind.org/ ADDitude Magazine ThirdIncome.ca

## 2022-05-02 ENCOUNTER — Other Ambulatory Visit: Payer: Self-pay

## 2022-05-02 MED ORDER — AMPHETAMINE SULFATE 10 MG PO TABS: 10.0000 mg | ORAL_TABLET | Freq: Two times a day (BID) | ORAL | 0 refills | Status: AC

## 2022-05-02 MED ORDER — BUSPIRONE HCL 15 MG PO TABS: 15.0000 mg | ORAL_TABLET | Freq: Every day | ORAL | 2 refills | Status: AC

## 2022-05-02 NOTE — Telephone Encounter (Signed)
RX for above e-scribed and sent to pharmacy on record  [id number] - [place], [place] - [address] AT [address] [place] [address] Phone: [phone] Fax: [phone] 

## 2022-05-02 NOTE — Addendum Note (Signed)
Addended by: Chandria Rookstool A on: 05/02/2022 01:41 PM   Modules accepted: Orders

## 2022-05-02 NOTE — Telephone Encounter (Signed)
[facility] does not have Ekeo in [place] would like it sent to [facility] on [address]

## 2022-05-18 DIAGNOSIS — B360 Pityriasis versicolor: Secondary | ICD-10-CM | POA: Diagnosis not present

## 2022-11-24 DIAGNOSIS — F901 Attention-deficit hyperactivity disorder, predominantly hyperactive type: Secondary | ICD-10-CM | POA: Diagnosis not present

## 2022-11-24 DIAGNOSIS — F419 Anxiety disorder, unspecified: Secondary | ICD-10-CM | POA: Diagnosis not present

## 2022-12-22 DIAGNOSIS — Z0001 Encounter for general adult medical examination with abnormal findings: Secondary | ICD-10-CM | POA: Diagnosis not present

## 2022-12-22 DIAGNOSIS — F901 Attention-deficit hyperactivity disorder, predominantly hyperactive type: Secondary | ICD-10-CM | POA: Diagnosis not present

## 2023-09-28 DIAGNOSIS — S93491A Sprain of other ligament of right ankle, initial encounter: Secondary | ICD-10-CM | POA: Diagnosis not present

## 2023-11-26 DIAGNOSIS — M62838 Other muscle spasm: Secondary | ICD-10-CM | POA: Diagnosis not present

## 2023-11-26 DIAGNOSIS — F419 Anxiety disorder, unspecified: Secondary | ICD-10-CM | POA: Diagnosis not present

## 2023-11-26 DIAGNOSIS — Z113 Encounter for screening for infections with a predominantly sexual mode of transmission: Secondary | ICD-10-CM | POA: Diagnosis not present

## 2023-11-26 DIAGNOSIS — F901 Attention-deficit hyperactivity disorder, predominantly hyperactive type: Secondary | ICD-10-CM | POA: Diagnosis not present

## 2023-11-26 DIAGNOSIS — L74519 Primary focal hyperhidrosis, unspecified: Secondary | ICD-10-CM | POA: Diagnosis not present

## 2023-11-26 DIAGNOSIS — Z6823 Body mass index (BMI) 23.0-23.9, adult: Secondary | ICD-10-CM | POA: Diagnosis not present

## 2023-11-26 DIAGNOSIS — R5383 Other fatigue: Secondary | ICD-10-CM | POA: Diagnosis not present

## 2024-01-25 DIAGNOSIS — R2232 Localized swelling, mass and lump, left upper limb: Secondary | ICD-10-CM | POA: Diagnosis not present

## 2024-01-25 DIAGNOSIS — M25532 Pain in left wrist: Secondary | ICD-10-CM | POA: Diagnosis not present

## 2024-01-25 DIAGNOSIS — S6992XA Unspecified injury of left wrist, hand and finger(s), initial encounter: Secondary | ICD-10-CM | POA: Diagnosis not present

## 2024-01-25 DIAGNOSIS — M79642 Pain in left hand: Secondary | ICD-10-CM | POA: Diagnosis not present

## 2024-01-25 DIAGNOSIS — M7989 Other specified soft tissue disorders: Secondary | ICD-10-CM | POA: Diagnosis not present
